# Patient Record
Sex: Male | Born: 1995 | Race: Black or African American | Hispanic: No | Marital: Single | Smoking: Current every day smoker
Health system: Southern US, Community
[De-identification: ages and names within clinical notes are randomized; demographics above are authoritative.]

## PROBLEM LIST (undated history)

## (undated) DIAGNOSIS — F909 Attention-deficit hyperactivity disorder, unspecified type: Secondary | ICD-10-CM

---
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
## 2020-09-04 ENCOUNTER — Emergency Department: Payer: Self-pay

## 2020-09-04 ENCOUNTER — Other Ambulatory Visit: Payer: Self-pay

## 2020-09-04 ENCOUNTER — Encounter: Payer: Self-pay | Admitting: Emergency Medicine

## 2020-09-04 DIAGNOSIS — N451 Epididymitis: Secondary | ICD-10-CM | POA: Insufficient documentation

## 2020-09-04 DIAGNOSIS — F17290 Nicotine dependence, other tobacco product, uncomplicated: Secondary | ICD-10-CM | POA: Insufficient documentation

## 2020-09-04 LAB — CBC WITH DIFFERENTIAL/PLATELET
Abs Immature Granulocytes: 0.03 10*3/uL (ref 0.00–0.07)
Basophils Absolute: 0 10*3/uL (ref 0.0–0.1)
Basophils Relative: 0 %
Eosinophils Absolute: 0 10*3/uL (ref 0.0–0.5)
Eosinophils Relative: 0 %
HCT: 43.2 % (ref 39.0–52.0)
Hemoglobin: 13.5 g/dL (ref 13.0–17.0)
Immature Granulocytes: 0 %
Lymphocytes Relative: 7 %
Lymphs Abs: 0.9 10*3/uL (ref 0.7–4.0)
MCH: 24 pg — ABNORMAL LOW (ref 26.0–34.0)
MCHC: 31.3 g/dL (ref 30.0–36.0)
MCV: 76.9 fL — ABNORMAL LOW (ref 80.0–100.0)
Monocytes Absolute: 1.2 10*3/uL — ABNORMAL HIGH (ref 0.1–1.0)
Monocytes Relative: 10 %
Neutro Abs: 9.9 10*3/uL — ABNORMAL HIGH (ref 1.7–7.7)
Neutrophils Relative %: 83 %
Platelets: 230 10*3/uL (ref 150–400)
RBC: 5.62 MIL/uL (ref 4.22–5.81)
RDW: 16.5 % — ABNORMAL HIGH (ref 11.5–15.5)
WBC: 12.1 10*3/uL — ABNORMAL HIGH (ref 4.0–10.5)
nRBC: 0 % (ref 0.0–0.2)

## 2020-09-04 NOTE — ED Triage Notes (Signed)
Pt to ED from home c/o left testicle pain x3-4 days.  States swollen, painful, denies injury or urinary changes or discharge.

## 2020-09-04 NOTE — ED Provider Notes (Signed)
[facility] Emergency Department Provider Note  ____________________________________________   None    (approximate)  I have reviewed the triage vital signs and the nursing notes.   HISTORY  Chief Complaint Testicle Pain    HPI Vincent Massey is a 25 y.o. male otherwise healthy who comes in with left testicle swelling and pain. Pt reports swelling and pain for 4 days, constant, nothing makes it better, worse with touching or moving testicles. Denies injuries. Denies new sexual contact. Denies urinary discharge/burning.           Past Medical History:  Diagnosis Date   ADHD     There are no problems to display for this patient.   History reviewed. No pertinent surgical history.  Prior to Admission medications   Not on File    Allergies Patient has no known allergies.  History reviewed. No pertinent family history.  Social History Social History   Tobacco Use   Smoking status: Every Day    Pack years: 0.00    Types: Cigars   Smokeless tobacco: Never  Substance Use Topics   Alcohol use: Yes    Comment: every other day   Drug use: Never      Review of Systems Constitutional: No fever/chills Eyes: No visual changes. ENT: No sore throat. Cardiovascular: Denies chest pain. Respiratory: Denies shortness of breath. Gastrointestinal: No abdominal pain.  No nausea, no vomiting.  No diarrhea.  No constipation. Genitourinary: left testicle pain  Musculoskeletal: Negative for back pain. Skin: Negative for rash. Neurological: Negative for headaches, focal weakness or numbness. All other ROS negative ____________________________________________   PHYSICAL EXAM:  VITAL SIGNS: ED Triage Vitals  Enc Vitals Group     BP 09/04/20 2257 129/81     Pulse Rate 09/04/20 2257 95     Resp 09/04/20 2257 20     Temp 09/04/20 2257 99.7 F (37.6 C)     Temp Source 09/04/20 2257 Oral     SpO2 09/04/20 2257 100 %     Weight 09/04/20 2312  200 lb (90.7 kg)     Height 09/04/20 2312 5\' 7"  (1.702 m)     Head Circumference --      Peak Flow --      Pain Score 09/04/20 2312 10     Pain Loc --      Pain Edu? --      Excl. in GC? --     Constitutional: Alert and oriented. Well appearing and in no acute distress. Eyes: Conjunctivae are normal. EOMI. Head: Atraumatic. Nose: No congestion/rhinnorhea. Mouth/Throat: Mucous membranes are moist.   Neck: No stridor. Trachea Midline. FROM Cardiovascular: Normal rate, regular rhythm. Grossly normal heart sounds.  Good peripheral circulation. Respiratory: Normal respiratory effort.  No retractions. Lungs CTAB. Gastrointestinal: Soft and nontender. No distention. No abdominal bruits.  Musculoskeletal: No lower extremity tenderness nor edema.  No joint effusions. Neurologic:  Normal speech and language. No gross focal neurologic deficits are appreciated.  Skin:  Skin is warm, dry and intact. No rash noted. Psychiatric: Mood and affect are normal. Speech and behavior are normal. GU: swelling and pain noted to the left testicle. Right testicle appears normal   ____________________________________________   LABS (all labs ordered are listed, but only abnormal results are displayed)  Labs Reviewed  CHLAMYDIA/NGC RT PCR ([facility] ONLY)            URINE CULTURE  CBC WITH DIFFERENTIAL/PLATELET  BASIC METABOLIC PANEL  URINALYSIS, COMPLETE (UACMP) WITH MICROSCOPIC  ____________________________________________   RADIOLOGY   Official radiology report(s): US SCROTUM W/DOPPLER  Result Date: 09/05/2020 CLINICAL DATA:  3-4 days of left testicular pain EXAM: SCROTAL ULTRASOUND DOPPLER ULTRASOUND OF THE TESTICLES TECHNIQUE: Complete ultrasound examination of the testicles, epididymis, and other scrotal structures was performed. Color and spectral Doppler ultrasound were also utilized to evaluate blood flow to the testicles. COMPARISON:  None. FINDINGS: Right testicle Measurements: 5.2 x 2.3 x  3.8 cm. No mass or microlithiasis visualized. Left testicle Measurements: 4.9 x 3.1 x 4.1 cm. No mass or microlithiasis visualized. Right epididymis:  Normal in size and appearance. Left epididymis: Thickened, heterogeneous and hypervascular appearance of the left epididymis. Hydrocele: Trace bilateral hydrocele, left greater than right. No clear septation or other concerning features of pyocele. Varicocele:  None visualized. Pulsed Doppler interrogation of both testes demonstrates normal low resistance arterial and venous waveforms bilaterally. IMPRESSION: Thickened heterogeneous and hypervascular appearance of the left epididymis compatible with epididymitis. No sonographic features of orchitis at this time. Trace bilateral hydroceles, left greater than right. Likely reactive without convincing sonographic features of pyocele at this time. Electronically Signed   By: Kreg Shropshire M.D.   On: 09/05/2020 00:06    ____________________________________________   PROCEDURES  Procedure(s) performed (including Critical Care):  Procedures   ____________________________________________   INITIAL IMPRESSION / ASSESSMENT AND PLAN / ED COURSE  Vincent Massey was evaluated in Emergency Department on 09/04/2020 for the symptoms described in the history of present illness. He was evaluated in the context of the global COVID-19 pandemic, which necessitated consideration that the patient might be at risk for infection with the SARS-CoV-2 virus that causes COVID-19. Institutional protocols and algorithms that pertain to the evaluation of patients at risk for COVID-19 are in a state of rapid change based on information released by regulatory bodies including the CDC and federal and state organizations. These policies and algorithms were followed during the patient's care in the ED.    [place] to evaluate for torsion/mass/epididymitis. No evidence of forneigh gangrene.  Will get G/C testing/ UA for UTI.    1:44 AM patient  has epididymitis on ultrasound.  Will treat with ceftriaxone   Discussed with patient he does report doing anal intercourse prior to this happening.  Has been with his partner for 2 months.  According to up-to-date we will do a course of levofloxacin instead of doxy due to recent anal intercourse for 10 days.  Patient's gonorrhea and Chlamydia test are still pending but patient is requesting to go.  He states that his Benedetto Goad is already here.  I explained to patient he is to follow-up in MyChart for these results to abstain from sex until treatment is completed and to have his partner tested/potentially even just treated if his gonorrhea and Chlamydia test are positive.  He expressed understanding and felt comfortable with this plan          ____________________________________________   FINAL CLINICAL IMPRESSION(S) / ED DIAGNOSES   Final diagnoses:  Pain in left testicle  Epididymitis      MEDICATIONS GIVEN DURING THIS VISIT:  Medications  acetaminophen (TYLENOL) tablet 1,000 mg (has no administration in time range)  ketorolac (TORADOL) 30 MG/ML injection 30 mg (has no administration in time range)  cefTRIAXone (ROCEPHIN) injection 500 mg (500 mg Intramuscular Given 09/05/20 0330)     ED Discharge Orders          Ordered    levofloxacin (LEVAQUIN) 500 MG tablet  Daily,   Status:  Discontinued  09/05/20 0423    levofloxacin (LEVAQUIN) 500 MG tablet  Daily        09/05/20 0425             Note:  This document was prepared using Dragon voice recognition software and may include unintentional dictation errors.    Concha Se, MD 09/05/20 [id number]

## 2020-09-05 ENCOUNTER — Emergency Department
Admission: EM | Admit: 2020-09-05 | Discharge: 2020-09-05 | Disposition: A | Discharge status: Home / Self Care | Payer: Self-pay | Attending: Emergency Medicine | Admitting: Emergency Medicine

## 2020-09-05 DIAGNOSIS — N451 Epididymitis: Secondary | ICD-10-CM

## 2020-09-05 DIAGNOSIS — N50812 Left testicular pain: Secondary | ICD-10-CM

## 2020-09-05 HISTORY — DX: Attention-deficit hyperactivity disorder, unspecified type: F90.9

## 2020-09-05 LAB — URINALYSIS, COMPLETE (UACMP) WITH MICROSCOPIC
Bilirubin Urine: NEGATIVE
Glucose, UA: NEGATIVE mg/dL
Hgb urine dipstick: NEGATIVE
Ketones, ur: NEGATIVE mg/dL
Nitrite: NEGATIVE
Protein, ur: NEGATIVE mg/dL
Specific Gravity, Urine: 1.025 (ref 1.005–1.030)
Squamous Epithelial / HPF: NONE SEEN (ref 0–5)
pH: 8 (ref 5.0–8.0)

## 2020-09-05 LAB — BASIC METABOLIC PANEL
Anion gap: 8 (ref 5–15)
BUN: 11 mg/dL (ref 6–20)
CO2: 25 mmol/L (ref 22–32)
Calcium: 9.1 mg/dL (ref 8.9–10.3)
Chloride: 105 mmol/L (ref 98–111)
Creatinine, Ser: 0.92 mg/dL (ref 0.61–1.24)
GFR, Estimated: >60 mL/min (ref >60)
Glucose, Bld: 104 mg/dL — ABNORMAL HIGH (ref 70–99)
Potassium: 4.2 mmol/L (ref 3.5–5.1)
Sodium: 138 mmol/L (ref 135–145)

## 2020-09-05 LAB — CHLAMYDIA/NGC RT PCR (ARMC ONLY)
Chlamydia Tr: NOT DETECTED
N gonorrhoeae: NOT DETECTED

## 2020-09-05 MED ORDER — LEVOFLOXACIN 500 MG PO TABS: 500.0000 mg | ORAL_TABLET | Freq: Every day | ORAL | 0 refills | Status: DC

## 2020-09-05 MED ORDER — KETOROLAC TROMETHAMINE 30 MG/ML IJ SOLN: 30.0000 mg | Freq: Once | INTRAMUSCULAR | Status: DC

## 2020-09-05 MED ORDER — LEVOFLOXACIN 500 MG PO TABS: 500.0000 mg | ORAL_TABLET | Freq: Every day | ORAL | 0 refills | Status: AC

## 2020-09-05 MED ORDER — ACETAMINOPHEN 500 MG PO TABS
1000.0000 mg | ORAL_TABLET | Freq: Once | ORAL | Status: AC
  Administered 2020-09-05: 1000 mg via ORAL
  Filled 2020-09-05: qty 2

## 2020-09-05 MED ORDER — CEFTRIAXONE SODIUM 1 G IJ SOLR
500.0000 mg | Freq: Once | INTRAMUSCULAR | Status: AC
  Administered 2020-09-05: 500 mg via INTRAMUSCULAR
  Filled 2020-09-05: qty 10

## 2020-09-05 NOTE — Discharge Instructions (Addendum)
No sexual intercourse until you complete your treatment.  Your partner should also be tested and should get treatment completed prior to sexual intercourse.

## 2020-09-06 LAB — URINE CULTURE: Culture: NO GROWTH

## 2020-12-01 ENCOUNTER — Ambulatory Visit: Payer: Self-pay

## 2020-12-03 ENCOUNTER — Encounter: Payer: Self-pay | Admitting: Physician Assistant

## 2020-12-03 ENCOUNTER — Other Ambulatory Visit: Payer: Self-pay

## 2020-12-03 ENCOUNTER — Ambulatory Visit: Payer: Self-pay | Admitting: Physician Assistant

## 2020-12-03 DIAGNOSIS — Z113 Encounter for screening for infections with a predominantly sexual mode of transmission: Secondary | ICD-10-CM

## 2020-12-03 DIAGNOSIS — N341 Nonspecific urethritis: Secondary | ICD-10-CM

## 2020-12-03 LAB — GRAM STAIN

## 2020-12-03 LAB — HM HIV SCREENING LAB: HM HIV Screening: NEGATIVE

## 2020-12-03 MED ORDER — DOXYCYCLINE HYCLATE 100 MG PO TABS: 100.0000 mg | ORAL_TABLET | Freq: Two times a day (BID) | ORAL | 0 refills | Status: AC

## 2020-12-03 NOTE — Progress Notes (Signed)
[facility] Department STI clinic/screening visit  Subjective:  Vincent Massey is a 25 y.o. male being seen today for an STI screening visit. The patient reports they do have symptoms.    Patient has the following medical conditions:  There are no problems to display for this patient.    Chief Complaint  Patient presents with   SEXUALLY TRANSMITTED DISEASE    screening    HPI  Patient reports that he has had dysuria and discharge off and on for 1 week.  Reports history of ADHD and a surgery as a baby but is not sure what the surgery was for.  Denies regular medicines.  Reports last HIV and Hepatitis testing was in May of this year and last void prior to sample collection for Gram stain was over 2 hr ago.   Screening for MPX risk: Does the patient have an unexplained rash? No Is the patient MSM? No Does the patient endorse multiple sex partners or anonymous sex partners? No Did the patient have close or sexual contact with a person diagnosed with MPX? No Has the patient traveled outside the [place] where MPX is endemic? No Is there a high clinical suspicion for MPX-- evidenced by one of the following No  -Unlikely to be chickenpox  -Lymphadenopathy  -Rash that present in same phase of evolution on any given body part   See flowsheet for further details and programmatic requirements.    The following portions of the patient's history were reviewed and updated as appropriate: allergies, current medications, past medical history, past social history, past surgical history and problem list.  Objective:  There were no vitals filed for this visit.  Physical Exam Constitutional:      General: He is not in acute distress.    Appearance: Normal appearance.  HENT:     Head: Normocephalic and atraumatic.     Comments: No nits,lice, or hair loss. No cervical, supraclavicular or axillary adenopathy.     Mouth/Throat:     Mouth: Mucous membranes are moist.     Pharynx:  Oropharynx is clear. No oropharyngeal exudate or posterior oropharyngeal erythema.  Eyes:     Conjunctiva/sclera: Conjunctivae normal.  Pulmonary:     Effort: Pulmonary effort is normal.  Abdominal:     Palpations: Abdomen is soft. There is no mass.     Tenderness: There is no abdominal tenderness. There is no guarding or rebound.  Genitourinary:    Penis: Normal.      Testes: Normal.     Comments: Pubic area without nits, lice, hair loss, edema, erythema, lesions and inguinal adenopathy. Penis circumcised without rash, lesions and discharge at meatus. Testicles descended bilaterally,nt, no masses or edema.  Musculoskeletal:     Cervical back: Neck supple. No tenderness.  Skin:    General: Skin is warm and dry.     Findings: No bruising, erythema, lesion or rash.  Neurological:     Mental Status: He is alert and oriented to person, place, and time.  Psychiatric:        Mood and Affect: Mood normal.        Behavior: Behavior normal.        Thought Content: Thought content normal.        Judgment: Judgment normal.      Assessment and Plan:  Vincent Massey is a 26 y.o. male presenting to the [facility] Department for STI screening  1. Screening for STD (sexually transmitted disease) Patient into  clinic with symptoms. Reviewed with patient Gram stain results.  Rec condoms with all sex. Await test results.  Counseled that RN will call if needs to RTC for treatment once results are back.  - Gram stain - Gonococcus culture - HIV [facility] [facility] - Syphilis Serology, [facility]  2. NGU (nongonococcal urethritis) Treat NGU with Doxycycline 100 mg #14 1 po BID for 7 days. No sex for 14 days and until after partner completes treatment. Call with questions or concerns.  - doxycycline (VIBRA-TABS) 100 MG tablet; Take 1 tablet (100 mg total) by mouth 2 (two) times daily for 7 days.  Dispense: 14 tablet; Refill: 0     No follow-ups on file.  No future  appointments.  Matt Holmes, PA

## 2020-12-05 NOTE — Progress Notes (Signed)
Chart reviewed by Pharmacist  Suzanne Walker PharmD, Contract Pharmacist at [facility]  

## 2020-12-08 LAB — GONOCOCCUS CULTURE

## 2021-02-01 ENCOUNTER — Ambulatory Visit: Payer: Self-pay | Admitting: Family Medicine

## 2021-02-01 ENCOUNTER — Other Ambulatory Visit: Payer: Self-pay

## 2021-02-01 ENCOUNTER — Encounter: Payer: Self-pay | Admitting: Family Medicine

## 2021-02-01 DIAGNOSIS — Z113 Encounter for screening for infections with a predominantly sexual mode of transmission: Secondary | ICD-10-CM

## 2021-02-01 DIAGNOSIS — A549 Gonococcal infection, unspecified: Secondary | ICD-10-CM

## 2021-02-01 DIAGNOSIS — Z299 Encounter for prophylactic measures, unspecified: Secondary | ICD-10-CM

## 2021-02-01 LAB — HM HIV SCREENING LAB: HM HIV Screening: NEGATIVE

## 2021-02-01 LAB — HEPATITIS B SURFACE ANTIGEN: Hepatitis B Surface Ag: NONREACTIVE

## 2021-02-01 LAB — HM HEPATITIS C SCREENING LAB: HM Hepatitis Screen: NEGATIVE

## 2021-02-01 MED ORDER — CEFTRIAXONE SODIUM 500 MG IJ SOLR
500.0000 mg | Freq: Once | INTRAMUSCULAR | Status: AC
Start: 2021-02-01 — End: 2021-02-01
  Administered 2021-02-01: 500 mg via INTRAMUSCULAR

## 2021-02-01 MED ORDER — DOXYCYCLINE HYCLATE 100 MG PO TABS: 100.0000 mg | ORAL_TABLET | Freq: Two times a day (BID) | ORAL | 0 refills | Status: AC

## 2021-02-01 NOTE — Progress Notes (Signed)
[facility] Department STI clinic/screening visit  Subjective:  Vincent Massey is a 25 y.o. male being seen today for an STI screening visit. The patient reports they do have symptoms.    Patient has the following medical conditions:  There are no problems to display for this patient.    Chief Complaint  Patient presents with   SEXUALLY TRANSMITTED DISEASE    Screening    HPI  Patient reports here for screening   Does the patient or their partner desires a pregnancy in the next year? No  Screening for MPX risk: Does the patient have an unexplained rash? No Is the patient MSM? No Does the patient endorse multiple sex partners or anonymous sex partners? Yes Did the patient have close or sexual contact with a person diagnosed with MPX? No Has the patient traveled outside the [place] where MPX is endemic? No Is there a high clinical suspicion for MPX-- evidenced by one of the following No  -Unlikely to be chickenpox  -Lymphadenopathy  -Rash that present in same phase of evolution on any given body part   See flowsheet for further details and programmatic requirements.    The following portions of the patient's history were reviewed and updated as appropriate: allergies, current medications, past medical history, past social history, past surgical history and problem list.  Objective:  There were no vitals filed for this visit.  Physical Exam Constitutional:      Appearance: Normal appearance.  HENT:     Head: Normocephalic.     Mouth/Throat:     Mouth: Mucous membranes are moist.     Pharynx: Oropharynx is clear. No oropharyngeal exudate.  Pulmonary:     Effort: Pulmonary effort is normal.  Genitourinary:    Comments: Deferred  Musculoskeletal:     Cervical back: Normal range of motion.  Lymphadenopathy:     Cervical: No cervical adenopathy.  Skin:    General: Skin is warm and dry.     Findings: No bruising, erythema, lesion or rash.  Neurological:      Mental Status: He is alert.  Psychiatric:        Mood and Affect: Mood normal.        Behavior: Behavior normal.      Assessment and Plan:  Vincent Massey is a 25 y.o. male presenting to the [facility] Department for STI screening  1. Screening examination for venereal disease Patient does have STI symptoms Patient accepted all screenings including bloodwork for HIV/RPR. Patient has taken antibiotics for the last 2-3 days.   Discussed potential false negative results d/t abt intake.  Patient meets criteria for HepB screening? Yes. Ordered? Yes Patient meets criteria for HepC screening? Yes. Ordered? Yes Recommended condom use with all sex Discussed importance of condom use for STI prevent   Discussed time line for [facility] results and that patient will be called with positive results and encouraged patient to call if he had not heard in 2 weeks Recommended returning for continued or worsening symptoms.    - Syphilis Serology, [facility] - HBV Antigen/Antibody [facility] - HIV/HCV [facility]  2. Gonorrhea  - cefTRIAXone (ROCEPHIN) injection 500 mg - doxycycline (VIBRA-TABS) 100 MG tablet; Take 1 tablet (100 mg total) by mouth 2 (two) times daily for 7 days.  Dispense: 14 tablet; Refill: 0  3. Prophylactic measure Treated for gonorrhea and chlamydia prophylactically d/t unable to collect specimens today.       No  follow-ups on file.  No future appointments.  Wendi Snipes, FNP

## 2021-02-01 NOTE — Progress Notes (Signed)
Pt here for STD screening and treatment.  Medication dispensed per Provider orders.  Ceftriaxone 500 mg given IM without any complications. Berdie Ogren, RN

## 2021-02-24 ENCOUNTER — Emergency Department (HOSPITAL_COMMUNITY)
Admission: EM | Admit: 2021-02-24 | Discharge: 2021-02-24 | Disposition: A | Discharge status: Home / Self Care | Payer: No Typology Code available for payment source | Attending: Emergency Medicine | Admitting: Emergency Medicine

## 2021-02-24 ENCOUNTER — Emergency Department (HOSPITAL_COMMUNITY): Payer: No Typology Code available for payment source

## 2021-02-24 ENCOUNTER — Encounter (HOSPITAL_COMMUNITY): Payer: Self-pay | Admitting: Emergency Medicine

## 2021-02-24 DIAGNOSIS — F17210 Nicotine dependence, cigarettes, uncomplicated: Secondary | ICD-10-CM | POA: Insufficient documentation

## 2021-02-24 DIAGNOSIS — M79662 Pain in left lower leg: Secondary | ICD-10-CM | POA: Diagnosis not present

## 2021-02-24 DIAGNOSIS — R079 Chest pain, unspecified: Secondary | ICD-10-CM | POA: Diagnosis not present

## 2021-02-24 DIAGNOSIS — M7918 Myalgia, other site: Secondary | ICD-10-CM

## 2021-02-24 DIAGNOSIS — R1012 Left upper quadrant pain: Secondary | ICD-10-CM | POA: Insufficient documentation

## 2021-02-24 DIAGNOSIS — R519 Headache, unspecified: Secondary | ICD-10-CM | POA: Diagnosis not present

## 2021-02-24 DIAGNOSIS — M542 Cervicalgia: Secondary | ICD-10-CM | POA: Insufficient documentation

## 2021-02-24 MED ORDER — IOHEXOL 300 MG/ML  SOLN
100.0000 mL | Freq: Once | INTRAMUSCULAR | Status: AC | PRN
  Administered 2021-02-24: 21:00:00 100 mL via INTRAVENOUS

## 2021-02-24 MED ORDER — METHOCARBAMOL 500 MG PO TABS
500.0000 mg | ORAL_TABLET | Freq: Four times a day (QID) | ORAL | 0 refills | Status: DC | PRN
Start: 2021-02-24 — End: 2021-03-26

## 2021-02-24 MED ORDER — MORPHINE SULFATE (PF) 4 MG/ML IV SOLN
4.0000 mg | Freq: Once | INTRAVENOUS | Status: AC
  Administered 2021-02-24: 19:00:00 4 mg via INTRAVENOUS
  Filled 2021-02-24: qty 1

## 2021-02-24 MED ORDER — HYDROMORPHONE HCL 1 MG/ML IJ SOLN
0.5000 mg | Freq: Once | INTRAMUSCULAR | Status: AC
  Administered 2021-02-24: 22:00:00 0.5 mg via INTRAVENOUS
  Filled 2021-02-24: qty 1

## 2021-02-24 MED ORDER — ONDANSETRON HCL 4 MG/2ML IJ SOLN
4.0000 mg | Freq: Once | INTRAMUSCULAR | Status: AC
  Administered 2021-02-24: 19:00:00 4 mg via INTRAVENOUS
  Filled 2021-02-24: qty 2

## 2021-02-24 MED ORDER — NAPROXEN 500 MG PO TABS: 500.0000 mg | ORAL_TABLET | Freq: Two times a day (BID) | ORAL | 0 refills | Status: DC | PRN
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
## 2021-02-24 NOTE — ED Notes (Signed)
Pt refusing to sign MSE unless he can read it. RN printed it out. Pt states he still cant read it.

## 2021-03-15 ENCOUNTER — Ambulatory Visit: Payer: Self-pay

## 2021-03-16 ENCOUNTER — Ambulatory Visit: Payer: Self-pay | Admitting: Gerontology

## 2021-03-21 ENCOUNTER — Ambulatory Visit: Payer: Self-pay

## 2021-03-26 ENCOUNTER — Emergency Department
Admission: EM | Admit: 2021-03-26 | Discharge: 2021-03-26 | Disposition: A | Discharge status: Home / Self Care | Payer: No Typology Code available for payment source | Attending: Emergency Medicine | Admitting: Emergency Medicine

## 2021-03-26 ENCOUNTER — Other Ambulatory Visit: Payer: Self-pay

## 2021-03-26 ENCOUNTER — Emergency Department: Payer: No Typology Code available for payment source

## 2021-03-26 DIAGNOSIS — Y92410 Unspecified street and highway as the place of occurrence of the external cause: Secondary | ICD-10-CM | POA: Insufficient documentation

## 2021-03-26 DIAGNOSIS — S50812A Abrasion of left forearm, initial encounter: Secondary | ICD-10-CM

## 2021-03-26 DIAGNOSIS — S30811A Abrasion of abdominal wall, initial encounter: Secondary | ICD-10-CM | POA: Insufficient documentation

## 2021-03-26 DIAGNOSIS — M25551 Pain in right hip: Secondary | ICD-10-CM | POA: Insufficient documentation

## 2021-03-26 DIAGNOSIS — M546 Pain in thoracic spine: Secondary | ICD-10-CM

## 2021-03-26 DIAGNOSIS — S0031XA Abrasion of nose, initial encounter: Secondary | ICD-10-CM | POA: Diagnosis not present

## 2021-03-26 DIAGNOSIS — S0990XA Unspecified injury of head, initial encounter: Secondary | ICD-10-CM | POA: Diagnosis not present

## 2021-03-26 DIAGNOSIS — S59912A Unspecified injury of left forearm, initial encounter: Secondary | ICD-10-CM | POA: Diagnosis present

## 2021-03-26 DIAGNOSIS — J984 Other disorders of lung: Secondary | ICD-10-CM | POA: Insufficient documentation

## 2021-03-26 LAB — PROTIME-INR
INR: 0.9 (ref 0.8–1.2)
Prothrombin Time: 12.6 seconds (ref 11.4–15.2)

## 2021-03-26 LAB — CBC WITH DIFFERENTIAL/PLATELET
Abs Immature Granulocytes: 0.12 10*3/uL — ABNORMAL HIGH (ref 0.00–0.07)
Basophils Absolute: 0.1 10*3/uL (ref 0.0–0.1)
Basophils Relative: 0 %
Eosinophils Absolute: 0 10*3/uL (ref 0.0–0.5)
Eosinophils Relative: 0 %
HCT: 45.2 % (ref 39.0–52.0)
Hemoglobin: 14.3 g/dL (ref 13.0–17.0)
Immature Granulocytes: 1 %
Lymphocytes Relative: 8 %
Lymphs Abs: 1.3 10*3/uL (ref 0.7–4.0)
MCH: 24.1 pg — ABNORMAL LOW (ref 26.0–34.0)
MCHC: 31.6 g/dL (ref 30.0–36.0)
MCV: 76.1 fL — ABNORMAL LOW (ref 80.0–100.0)
Monocytes Absolute: 1.6 10*3/uL — ABNORMAL HIGH (ref 0.1–1.0)
Monocytes Relative: 10 %
Neutro Abs: 12.7 10*3/uL — ABNORMAL HIGH (ref 1.7–7.7)
Neutrophils Relative %: 81 %
Platelets: 257 10*3/uL (ref 150–400)
RBC: 5.94 MIL/uL — ABNORMAL HIGH (ref 4.22–5.81)
RDW: 15.6 % — ABNORMAL HIGH (ref 11.5–15.5)
WBC: 15.8 10*3/uL — ABNORMAL HIGH (ref 4.0–10.5)
nRBC: 0 % (ref 0.0–0.2)

## 2021-03-26 LAB — TYPE AND SCREEN
ABO/RH(D): B POS
Antibody Screen: NEGATIVE

## 2021-03-26 LAB — COMPREHENSIVE METABOLIC PANEL
ALT: 22 U/L (ref 0–44)
AST: 31 U/L (ref 15–41)
Albumin: 4.6 g/dL (ref 3.5–5.0)
Alkaline Phosphatase: 59 U/L (ref 38–126)
Anion gap: 10 (ref 5–15)
BUN: 19 mg/dL (ref 6–20)
CO2: 26 mmol/L (ref 22–32)
Calcium: 9.8 mg/dL (ref 8.9–10.3)
Chloride: 104 mmol/L (ref 98–111)
Creatinine, Ser: 1.38 mg/dL — ABNORMAL HIGH (ref 0.61–1.24)
GFR, Estimated: >60 mL/min (ref >60)
Glucose, Bld: 78 mg/dL (ref 70–99)
Potassium: 4.5 mmol/L (ref 3.5–5.1)
Sodium: 140 mmol/L (ref 135–145)
Total Bilirubin: 0.5 mg/dL (ref 0.3–1.2)
Total Protein: 7.8 g/dL (ref 6.5–8.1)

## 2021-03-26 LAB — ETHANOL: Alcohol, Ethyl (B): <10 mg/dL (ref <10)

## 2021-03-26 MED ORDER — CYCLOBENZAPRINE HCL 5 MG PO TABS: 5.0000 mg | ORAL_TABLET | Freq: Three times a day (TID) | ORAL | 0 refills | Status: AC | PRN

## 2021-03-26 MED ORDER — IOHEXOL 300 MG/ML  SOLN
100.0000 mL | Freq: Once | INTRAMUSCULAR | Status: AC | PRN
  Administered 2021-03-26: 100 mL via INTRAVENOUS

## 2021-03-26 MED ORDER — LIDOCAINE 5 % EX PTCH
1.0000 | MEDICATED_PATCH | CUTANEOUS | Status: DC
  Administered 2021-03-26: 1 via TRANSDERMAL
  Filled 2021-03-26: qty 1

## 2021-03-26 MED ORDER — SODIUM CHLORIDE 0.9 % IV BOLUS
1000.0000 mL | Freq: Once | INTRAVENOUS | Status: AC
  Administered 2021-03-26: 1000 mL via INTRAVENOUS

## 2021-03-26 MED ORDER — ACETAMINOPHEN 500 MG PO TABS
1000.0000 mg | ORAL_TABLET | Freq: Once | ORAL | Status: AC
  Administered 2021-03-26: 1000 mg via ORAL
  Filled 2021-03-26: qty 2
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
## 2021-03-28 ENCOUNTER — Ambulatory Visit: Payer: Self-pay
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
